# Patient Record
Sex: Male | Born: 1990 | Race: Black or African American | Hispanic: No | Marital: Single | Smoking: Former smoker
Health system: Southern US, Community
[De-identification: ages and names within clinical notes are randomized; demographics above are authoritative.]

---

## 2017-03-01 ENCOUNTER — Emergency Department (HOSPITAL_COMMUNITY)
Admission: EM | Admit: 2017-03-01 | Discharge: 2017-03-01 | Disposition: A | Discharge status: Other Acute Inpatient Hospital | Payer: BLUE CROSS/BLUE SHIELD | Attending: Emergency Medicine | Admitting: Emergency Medicine

## 2017-03-01 ENCOUNTER — Encounter (HOSPITAL_COMMUNITY): Payer: Self-pay | Admitting: *Deleted

## 2017-03-01 ENCOUNTER — Emergency Department (HOSPITAL_COMMUNITY): Payer: BLUE CROSS/BLUE SHIELD

## 2017-03-01 ENCOUNTER — Emergency Department (HOSPITAL_COMMUNITY)
Admission: EM | Admit: 2017-03-01 | Discharge: 2017-03-01 | Disposition: A | Discharge status: Home / Self Care | Payer: Self-pay | Attending: Dermatology | Admitting: Dermatology

## 2017-03-01 DIAGNOSIS — K0889 Other specified disorders of teeth and supporting structures: Secondary | ICD-10-CM | POA: Diagnosis present

## 2017-03-01 DIAGNOSIS — L0201 Cutaneous abscess of face: Secondary | ICD-10-CM | POA: Diagnosis not present

## 2017-03-01 DIAGNOSIS — K047 Periapical abscess without sinus: Secondary | ICD-10-CM | POA: Insufficient documentation

## 2017-03-01 DIAGNOSIS — R220 Localized swelling, mass and lump, head: Secondary | ICD-10-CM | POA: Insufficient documentation

## 2017-03-01 DIAGNOSIS — Z5321 Procedure and treatment not carried out due to patient leaving prior to being seen by health care provider: Secondary | ICD-10-CM | POA: Insufficient documentation

## 2017-03-01 DIAGNOSIS — Z87891 Personal history of nicotine dependence: Secondary | ICD-10-CM | POA: Insufficient documentation

## 2017-03-01 LAB — CBC WITH DIFFERENTIAL/PLATELET
Basophils Absolute: 0 10*3/uL (ref 0.0–0.1)
Basophils Relative: 0 %
Eosinophils Absolute: 0 10*3/uL (ref 0.0–0.7)
Eosinophils Relative: 0 %
HCT: 45.3 % (ref 39.0–52.0)
Hemoglobin: 16.5 g/dL (ref 13.0–17.0)
Lymphocytes Relative: 14 %
Lymphs Abs: 1.5 10*3/uL (ref 0.7–4.0)
MCH: 34 pg (ref 26.0–34.0)
MCHC: 36.4 g/dL — ABNORMAL HIGH (ref 30.0–36.0)
MCV: 93.2 fL (ref 78.0–100.0)
Monocytes Absolute: 1.3 10*3/uL — ABNORMAL HIGH (ref 0.1–1.0)
Monocytes Relative: 12 %
Neutro Abs: 7.8 10*3/uL — ABNORMAL HIGH (ref 1.7–7.7)
Neutrophils Relative %: 74 %
Platelets: 227 10*3/uL (ref 150–400)
RBC: 4.86 MIL/uL (ref 4.22–5.81)
RDW: 12.6 % (ref 11.5–15.5)
WBC: 10.7 10*3/uL — ABNORMAL HIGH (ref 4.0–10.5)

## 2017-03-01 LAB — BASIC METABOLIC PANEL
Anion gap: 10 (ref 5–15)
BUN: 15 mg/dL (ref 6–20)
CO2: 29 mmol/L (ref 22–32)
Calcium: 9.3 mg/dL (ref 8.9–10.3)
Chloride: 94 mmol/L — ABNORMAL LOW (ref 101–111)
Creatinine, Ser: 1.12 mg/dL (ref 0.61–1.24)
GFR calc Af Amer: >60 mL/min (ref >60)
GFR calc non Af Amer: >60 mL/min (ref >60)
Glucose, Bld: 103 mg/dL — ABNORMAL HIGH (ref 65–99)
Potassium: 3.5 mmol/L (ref 3.5–5.1)
Sodium: 133 mmol/L — ABNORMAL LOW (ref 135–145)

## 2017-03-01 MED ORDER — CLINDAMYCIN PHOSPHATE 600 MG/50ML IV SOLN
600.0000 mg | Freq: Once | INTRAVENOUS | Status: AC
  Administered 2017-03-01: 600 mg via INTRAVENOUS
  Filled 2017-03-01: qty 50

## 2017-03-01 MED ORDER — IOPAMIDOL (ISOVUE-300) INJECTION 61%
75.0000 mL | Freq: Once | INTRAVENOUS | Status: AC | PRN
  Administered 2017-03-01: 75 mL via INTRAVENOUS

## 2017-03-01 MED ORDER — HYDROMORPHONE HCL 1 MG/ML IJ SOLN
1.0000 mg | Freq: Once | INTRAMUSCULAR | Status: AC
  Administered 2017-03-01: 1 mg via INTRAVENOUS
  Filled 2017-03-01: qty 1

## 2017-03-01 MED ORDER — KETOROLAC TROMETHAMINE 30 MG/ML IJ SOLN
15.0000 mg | Freq: Once | INTRAMUSCULAR | Status: AC
  Administered 2017-03-01: 15 mg via INTRAVENOUS
  Filled 2017-03-01: qty 1

## 2017-03-01 MED ORDER — SODIUM CHLORIDE 0.9 % IV BOLUS (SEPSIS)
1000.0000 mL | Freq: Once | INTRAVENOUS | Status: AC
  Administered 2017-03-01: 1000 mL via INTRAVENOUS

## 2017-03-01 NOTE — ED Triage Notes (Signed)
Pt is here for swelling to the right side of his face.  Pt was seen at his pcp and placed on antibiotics about 3 weeks ago, the problem didn't resolve and he was seen [facility] a [facility] ago where he had a CT and was told this is a abscess in his jaw, he was given new antibiotics.  Pt states that the pain and swelling continue, he reports difficulty eating, opening his mouth, nausea and some vomiting as well as chills at home.  No respiratory distress at this time.

## 2017-03-01 NOTE — ED Notes (Signed)
Pt's named called in the waiting room x2.  He did not answer.

## 2017-03-01 NOTE — ED Provider Notes (Signed)
[facility]-EMERGENCY DEPT Provider Note   CSN: [id number] Arrival date & time: 03/01/17  1633  By signing my name below, I, Sonum Patel, attest that this documentation has been prepared under the direction and in the presence of Raeford Razor, MD. Electronically Signed: Leone Payor, Scribe. 03/01/17. 5:25 PM.  History   Chief Complaint Chief Complaint  Patient presents with  . Dental Problem    abscess in jaw    The history is provided by the patient and a friend. No language interpreter was used.     HPI Comments: Jay Rowe is a 26 y.o. male who presents to the Emergency Department complaining of gradual onset, constant, gradually worsened right sided facial swelling that began 3 weeks ago. He reports associated subjective fever, chills, and pain with swallowing. He reports tasting drainage but is unable to open his mouth enough to brush his teeth. He was seen at [facility] and had a CT which showed a facial abscess. He was given IV antibiotics and discharged with clindamycin which he has been taking without significant relief. He denies dental pain.   History reviewed. No pertinent past medical history.  There are no active problems to display for this patient.   History reviewed. No pertinent surgical history.     Home Medications    Prior to Admission medications   Medication Sig Start Date End Date Taking? Authorizing Provider  clindamycin (CLEOCIN) 150 MG capsule Take by mouth 3 (three) times daily. Began taking this 02/24/2017   Yes Historical Provider, MD    Family History No family history on file.  Social History Social History  Substance Use Topics  . Smoking status: Former Games developer  . Smokeless tobacco: Never Used  . Alcohol use No     Allergies   Sulfa antibiotics   Review of Systems Review of Systems   A complete 10 system review of systems was obtained and all systems are negative except as noted in the HPI and PMH.    Physical Exam Updated Vital  Signs BP (!) 138/96 (BP Location: Right Arm)   Pulse 74   Temp 97.9 F (36.6 C) (Axillary)   Resp 16   Wt 150 lb (68 kg)   SpO2 100%   Physical Exam  Constitutional: He is oriented to person, place, and time. He appears well-developed and well-nourished.  HENT:  Head: Normocephalic and atraumatic.  Mouth/Throat: There is trismus in the jaw.  Marked right sided facial swelling anterior to right ear and extending down below of the angle of the right mandible. Significant trismus. Cannot assess oropharynx. Normal sounding voice.   Eyes: EOM are normal.  Neck: Normal range of motion.  Cardiovascular: Normal rate, regular rhythm, normal heart sounds and intact distal pulses.   Pulmonary/Chest: Effort normal and breath sounds normal. No respiratory distress.  Abdominal: Soft. He exhibits no distension. There is no tenderness.  Musculoskeletal: Normal range of motion.  Neurological: He is alert and oriented to person, place, and time.  Skin: Skin is warm and dry.  Psychiatric: He has a normal mood and affect. Judgment normal.  Nursing note and vitals reviewed.    ED Treatments / Results  DIAGNOSTIC STUDIES: Oxygen Saturation is 100% on RA, normal by my interpretation.    COORDINATION OF CARE: 5:13 PM Discussed treatment plan with pt at bedside and pt agreed to plan.   Labs (all labs ordered are listed, but only abnormal results are displayed) Labs Reviewed - No data to display  EKG  EKG Interpretation  None       Radiology No results found.  Procedures Procedures (including critical care time)  Medications Ordered in ED Medications  sodium chloride 0.9 % bolus 1,000 mL (not administered)  ketorolac (TORADOL) 30 MG/ML injection 15 mg (not administered)  HYDROmorphone (DILAUDID) injection 1 mg (not administered)     Initial Impression / Assessment and Plan / ED Course  I have reviewed the triage vital signs and the nursing notes.  Pertinent labs & imaging results  that were available during my care of the patient were reviewed by me and considered in my medical decision making (see chart for details).     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 He has been on antibiotics for several weeks with no improvement. Unfortunately, there is no local oral surgery coverage. Transfer [facility] management.  Final Clinical Impressions(s) / ED Diagnoses   Final diagnoses:  Dental abscess  Facial abscess    New Prescriptions New Prescriptions   No medications on file   I personally preformed the services scribed in my presence. The recorded information has been reviewed is accurate. Raeford RazorStephen Nyesha Cliff, MD.    Raeford RazorStephen Rishav Rockefeller, MD 03/06/17 1134

## 2017-03-01 NOTE — ED Triage Notes (Signed)
Pt has swelling to right side of face, has been seen at [facility] recently for same and was told it is not related to dental abscess but is a mass abscess. was treated with antibiotics but no relief, difficulty swallowing and severe pain. Airway intact at triage.

## 2017-03-01 NOTE — ED Notes (Signed)
Pt arrives at [facility] ED to be seen.

## 2017-11-19 IMAGING — CT CT MAXILLOFACIAL W/ CM
3 of 4 series · 16 of 47 positions shown, 19 images · IV contrast (agent unspecified)
Comparison: None.

CLINICAL DATA: Initial evaluation for right-sided facial swelling.

EXAM:
CT MAXILLOFACIAL WITH CONTRAST
TECHNIQUE: Multidetector CT imaging of the maxillofacial structures was
performed. Multiplanar CT image reconstructions were also generated.
A small metallic BB was placed on the right temple in order to
reliably differentiate right from left.

[Series 2: max soft · axial · 0.36mm/px · z∈[+30,+180]mm · 11 of 87 slices shown, 14 images]
[im 6/87  brain]
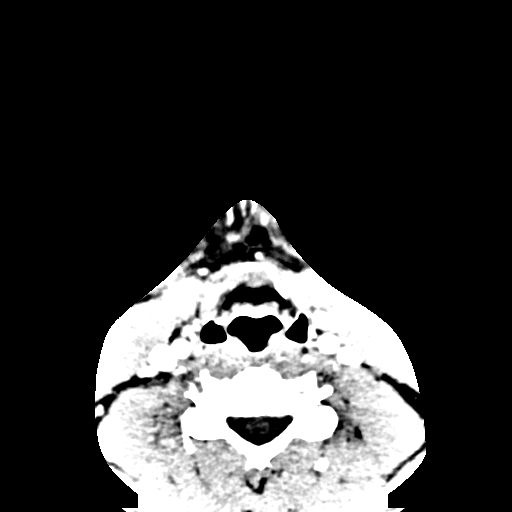
[im 6/87  bone]
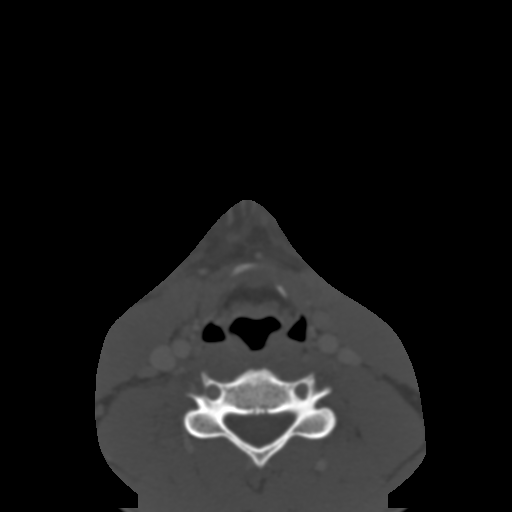
[im 12/87  bone]
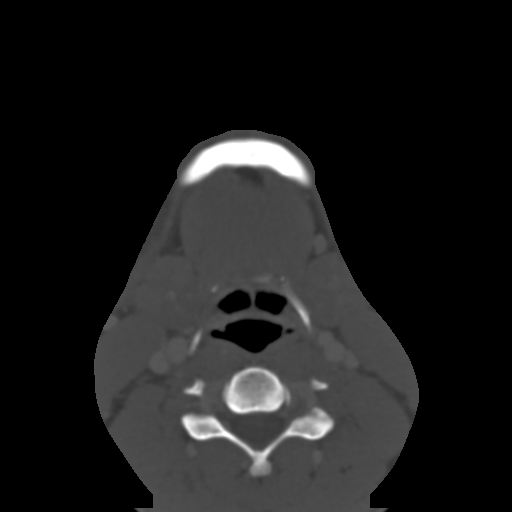
[im 21/87  bone]
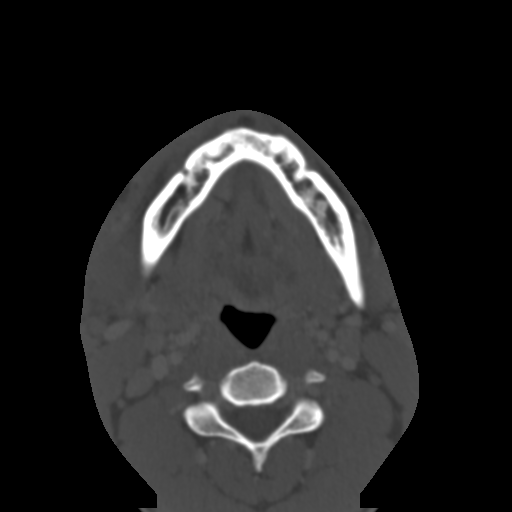
[im 27/87  bone]
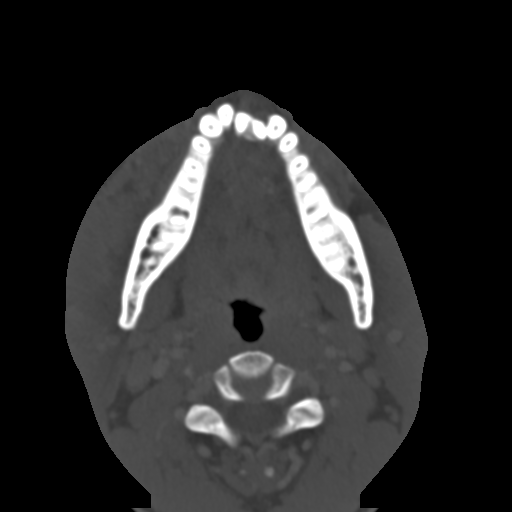
[im 36/87  brain]
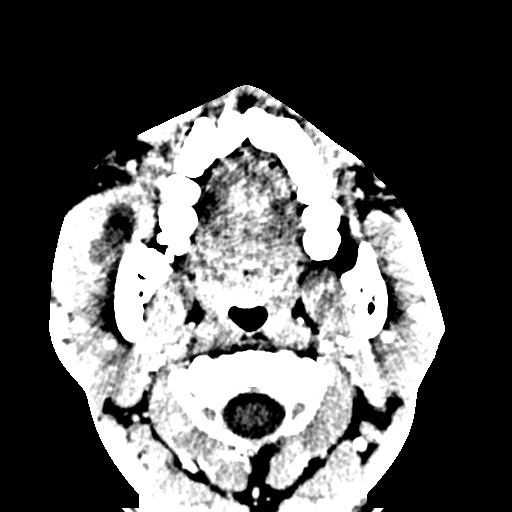
[im 36/87  bone]
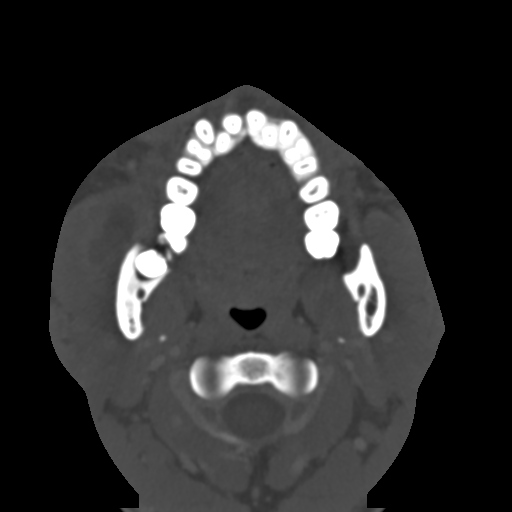
[im 45/87  bone]
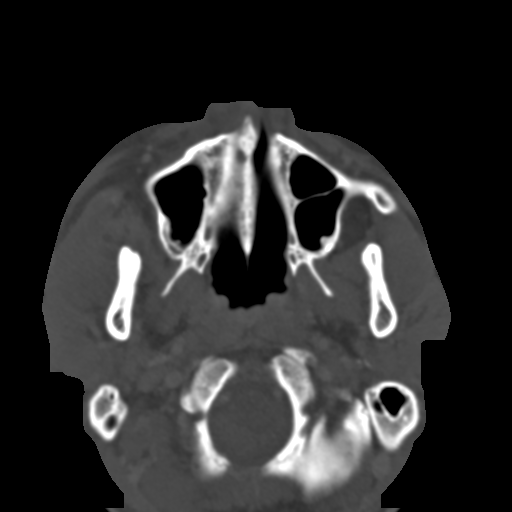
[im 51/87  bone]
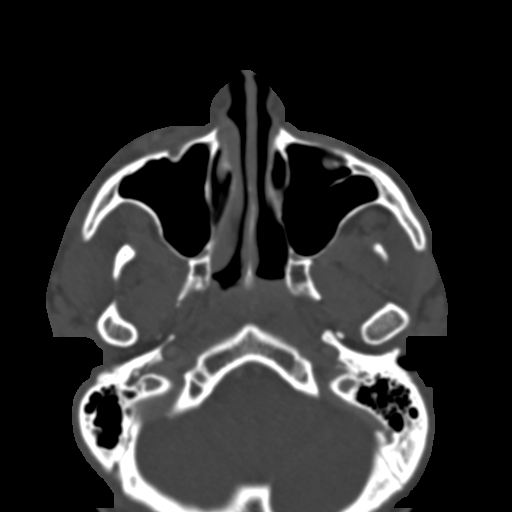
[im 60/87  bone]
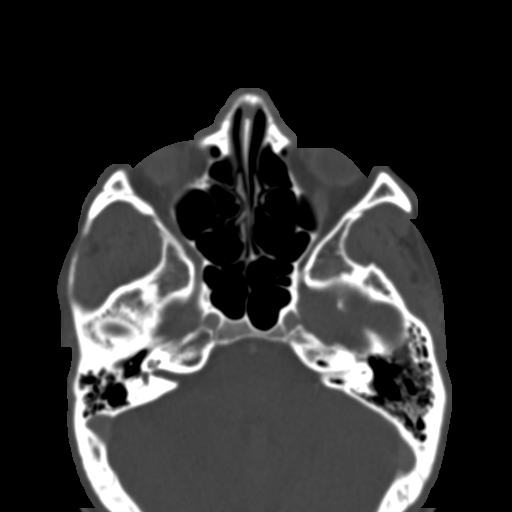
[im 66/87  brain]
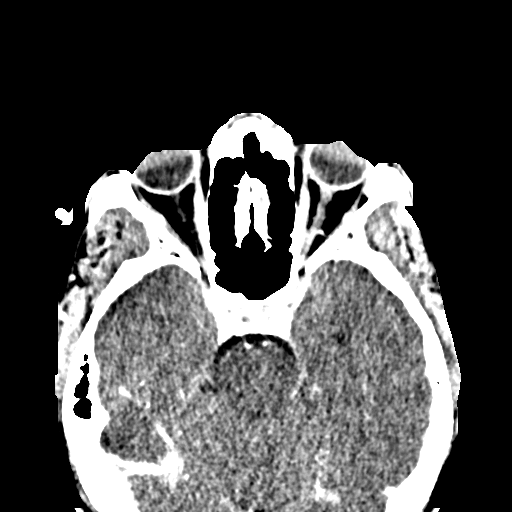
[im 66/87  bone]
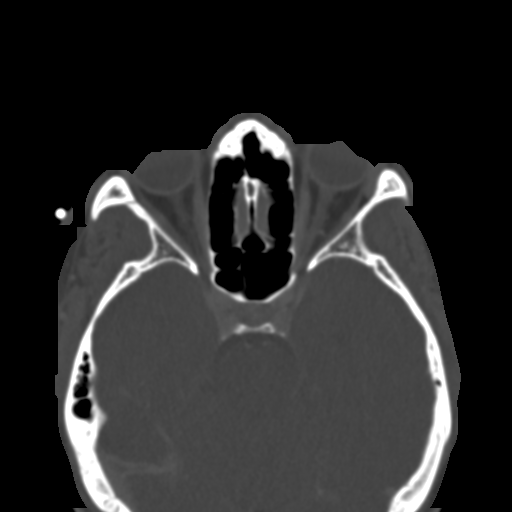
[im 75/87  bone]
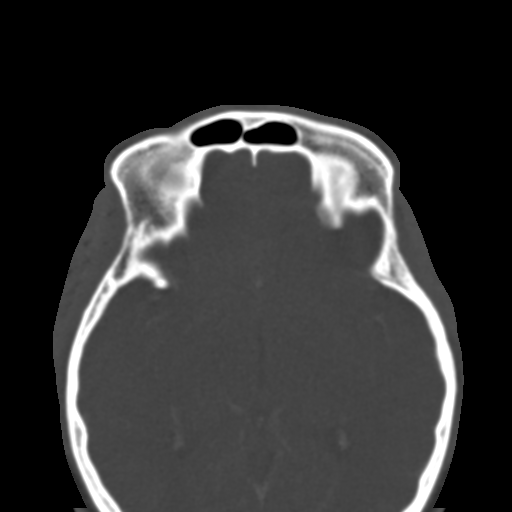
[im 81/87  bone]
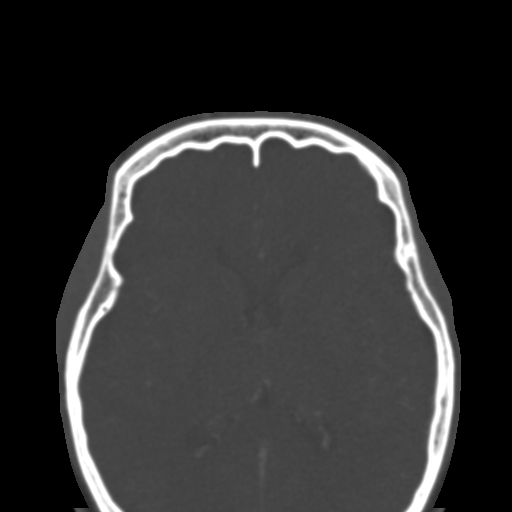

[Series 4: coronal soft · coronal · 0.33mm/px · 3 of 77 slices shown]
[im 26/77  bone]
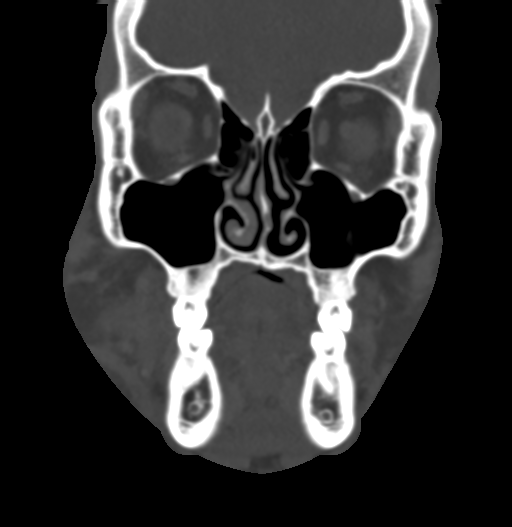
[im 34/77  bone]
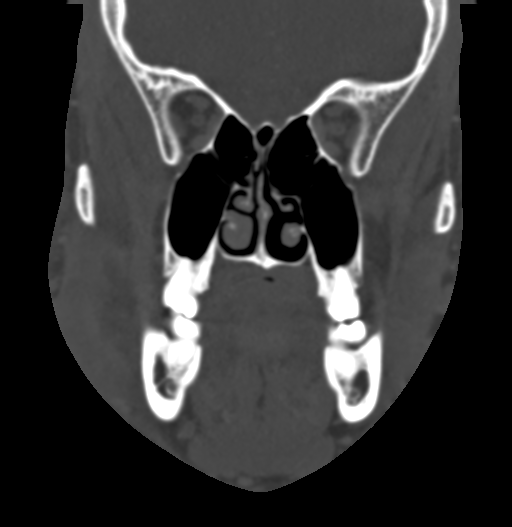
[im 43/77  bone]
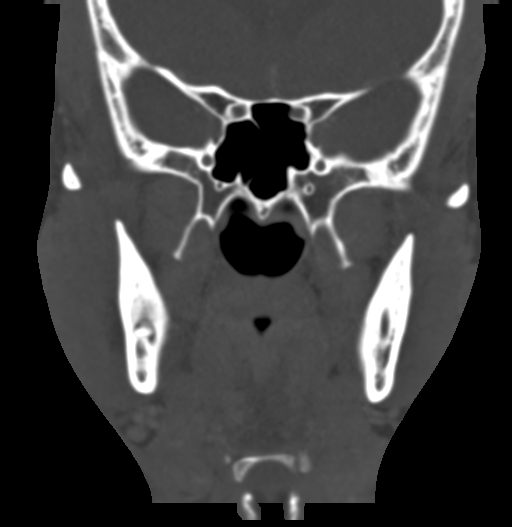

[Series 7: sagittal bone · sagittal · 0.34mm/px · 2 of 81 slices shown]
[im 27/81  bone]
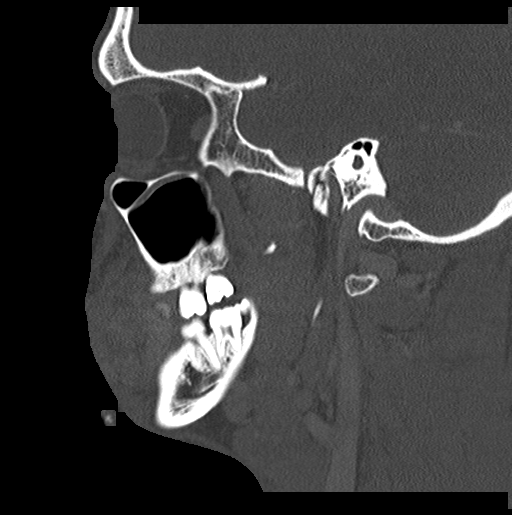
[im 54/81  bone]
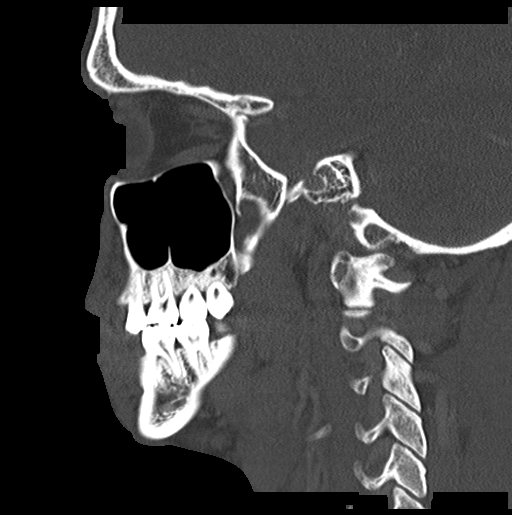

[16 of 47 positions shown; findings below may reference images not displayed]

FINDINGS: Osseous: No acute osseous abnormality within the face.

Orbits: Globes and orbital soft tissues within normal limits.

Sinuses: Paranasal sinuses are clear. Mastoids and middle ear
cavities are clear.

Soft tissues: Asymmetric soft tissue swelling with inflammatory
stranding present within the right masticator space, compatible with
cellulitis. There is a superimposed hypodense rim enhancing
collection within the right masseter muscle that measures 3.3 x
x 3.9 cm, consistent with abscess. Dental [place] with periapical
lucency within the adjacent right second/ third mandibular molars
suggest an odontogenic origin.

No other significant soft tissue swelling or other abnormality
within the face. Salivary glands within normal limits. Oral cavity
and oropharynx within normal limits.

Limited intracranial: Unremarkable.
IMPRESSION: Soft tissue swelling with inflammatory stranding within the right
masticator space, compatible with cellulitis. Superimposed 3.3 x
x 3.9 cm abscess within the right masseter musculature. Dental
caries with periapical lucency within the adjacent right mandibular
molars suggest an odontogenic origin.

## 2019-10-11 ENCOUNTER — Other Ambulatory Visit: Payer: Self-pay

## 2019-10-11 DIAGNOSIS — Z20822 Contact with and (suspected) exposure to covid-19: Secondary | ICD-10-CM

## 2019-10-13 LAB — NOVEL CORONAVIRUS, NAA: SARS-CoV-2, NAA: NOT DETECTED

## 2020-05-06 DIAGNOSIS — R197 Diarrhea, unspecified: Secondary | ICD-10-CM | POA: Diagnosis not present

## 2020-05-06 DIAGNOSIS — R1110 Vomiting, unspecified: Secondary | ICD-10-CM | POA: Diagnosis not present

## 2020-05-06 DIAGNOSIS — Z20828 Contact with and (suspected) exposure to other viral communicable diseases: Secondary | ICD-10-CM | POA: Diagnosis not present
